# Patient Record
Sex: Male | Born: 1975 | Smoking: Former smoker
Health system: Southern US, Community
[De-identification: ages and names within clinical notes are randomized; demographics above are authoritative.]

## PROBLEM LIST (undated history)

## (undated) DIAGNOSIS — K219 Gastro-esophageal reflux disease without esophagitis: Secondary | ICD-10-CM

## (undated) DIAGNOSIS — R413 Other amnesia: Principal | ICD-10-CM

## (undated) DIAGNOSIS — R51 Headache: Secondary | ICD-10-CM

## (undated) DIAGNOSIS — J45909 Unspecified asthma, uncomplicated: Secondary | ICD-10-CM

## (undated) HISTORY — DX: Unspecified asthma, uncomplicated: J45.909

## (undated) HISTORY — DX: Other amnesia: R41.3

## (undated) HISTORY — PX: KNEE SURGERY: SHX244

## (undated) HISTORY — DX: Headache: R51

## (undated) HISTORY — DX: Gastro-esophageal reflux disease without esophagitis: K21.9

---

## 2015-03-08 ENCOUNTER — Encounter: Payer: Self-pay | Admitting: Neurology

## 2015-03-08 ENCOUNTER — Ambulatory Visit (INDEPENDENT_AMBULATORY_CARE_PROVIDER_SITE_OTHER): Payer: BLUE CROSS/BLUE SHIELD | Admitting: Neurology

## 2015-03-08 VITALS — BP 127/84 | HR 70 | Ht 67.0 in | Wt 233.5 lb

## 2015-03-08 DIAGNOSIS — R519 Headache, unspecified: Secondary | ICD-10-CM

## 2015-03-08 DIAGNOSIS — R51 Headache: Secondary | ICD-10-CM

## 2015-03-08 DIAGNOSIS — R413 Other amnesia: Secondary | ICD-10-CM | POA: Diagnosis not present

## 2015-03-08 HISTORY — DX: Headache, unspecified: R51.9

## 2015-03-08 HISTORY — DX: Other amnesia: R41.3

## 2015-03-08 MED ORDER — VENLAFAXINE HCL ER 37.5 MG PO CP24: ORAL_CAPSULE | ORAL | Status: AC

## 2015-03-08 NOTE — Progress Notes (Signed)
Reason for visit: Headache  Referring physician: Dr. Hungarland  Leonard Khan is a 39 y.o. male  History of present illness:  Leonard Khan is a 39 year old right-handed white male with a history of chronic daily headaches dating back at least 2 decades. The patient indicates that the headaches are bifrontal in nature, spread to the back of the head including the neck and shoulders with tension in this area. The patient generally feels better in the morning, worse later in the day. He reports some nausea, no vomiting with the headache. Occasionally he will have black spots in front of vision, he will usually work through the headache, he indicates that he takes [place] powders up to 6 a day for the headache. Some days however, he does not take any medication. He drinks two soft drinks a day, and iced tea. He oftentimes has difficulty sleeping at night, and he takes medications for this. The patient has had some difficulty with memory over the last 6-12 months. He does have a family history of Alzheimer's disease, and he is concerned about this. He has a history of sleep apnea, he had inability to tolerate the CPAP, he underwent adenoidectomy which seemed to help. The patient does have some fatigue during the day. He denies any snoring. He denies any issues with balance or difficulty controlling the bowels or the bladder. He comes to this office for an evaluation. He has not had a recent head scan done.  Past Medical History  Diagnosis Date  . Asthma   . GERD (gastroesophageal reflux disease)   . Memory difficulties 03/08/2015  . Headache disorder 03/08/2015    Past Surgical History  Procedure Laterality Date  . Knee surgery      History reviewed. No pertinent family history.  Social history:  reports that he has quit smoking. He has never used smokeless tobacco. He reports that he drinks alcohol. He reports that he does not use illicit drugs.  Medications:  Prior to Admission medications     Medication Sig Start Date End Date Taking? Authorizing Provider  dexlansoprazole (DEXILANT) 60 MG capsule Take 60 mg by mouth daily.   Yes Historical Provider, MD  ergocalciferol (VITAMIN D2) 50000 UNITS capsule Take 50,000 Units by mouth once a week.   Yes Historical Provider, MD  venlafaxine XR (EFFEXOR XR) 37.5 MG 24 hr capsule One tablet daily for 1 week, then take 2 tablets daily 03/08/15   Kathrynn Ducking, MD  zolpidem (AMBIEN) 10 MG tablet Take 10 mg by mouth at bedtime as needed for sleep.   Yes Historical Provider, MD     No Known Allergies  ROS:  Out of a complete 14 system review of symptoms, the patient complains only of the following symptoms, and all other reviewed systems are negative.  Ringing in the ears Memory loss, headache Shift work  Blood pressure 127/84, pulse 70, height 5\' 7"  (1.702 m), weight 233 lb 8 oz (105.915 kg).  Physical Exam  General: The patient is alert and cooperative at the time of the examination.  Eyes: Pupils are equal, round, and reactive to light. Discs are flat bilaterally.  Neck: The neck is supple, no carotid bruits are noted.  Respiratory: The respiratory examination is clear.  Cardiovascular: The cardiovascular examination reveals a regular rate and rhythm, no obvious murmurs or rubs are noted.  Skin: Extremities are without significant edema.  Neurologic Exam  Mental status: The patient is alert and oriented x 3 at the time of  the examination. The patient has apparent normal recent and remote memory, with an apparently normal attention span and concentration ability. Mini-Mental Status Examination done today shows a total score 28/30. He is able to name 6 animals in 30 seconds.  Cranial nerves: Facial symmetry is present. There is good sensation of the face to pinprick and soft touch on the left, decreased on the right forehead. The strength of the facial muscles and the muscles to head turning and shoulder shrug are normal  bilaterally. Speech is well enunciated, no aphasia or dysarthria is noted. Extraocular movements are full. Visual fields are full. The tongue is midline, and the patient has symmetric elevation of the soft palate. No obvious hearing deficits are noted.  Motor: The motor testing reveals 5 over 5 strength of all 4 extremities. Good symmetric motor tone is noted throughout.  Sensory: Sensory testing is intact to pinprick, soft touch, vibration sensation, and position sense on all 4 extremities, with exception there is some decrease in pinprick sensation on the right arm. No evidence of extinction is noted.  Coordination: Cerebellar testing reveals good finger-nose-finger and heel-to-shin bilaterally.  Gait and station: Gait is normal. Tandem gait is normal. Romberg is negative. No drift is seen.  Reflexes: Deep tendon reflexes are symmetric and normal bilaterally. Toes are downgoing bilaterally.   Assessment/Plan:  1. Chronic daily headache  2. Memory disturbance  The patient is having a 20 year history of chronic daily headache. The patient likely has at least some component a muscle tension headache, but he appears to be using BC powders on a regular basis which may have some issues with rebound headache. The patient will try to stop the [facility] powders, and get off of the caffeine. This may help him sleep better at night. He will undergo MRI evaluation of the brain, and undergo blood work today. He will be placed on Effexor for the headache, he is to use Advil or Aleve for the headache when the headache comes on. He will follow-up in 3-4 months.  Jill Alexanders MD 03/08/2015 8:41 PM  [facility] [address] [place], [place] [address]  Phone [phone] Fax [phone]

## 2015-03-08 NOTE — Patient Instructions (Addendum)
We will check an MRI of the brain, and start Effexor for the headache. Try to eliminate BC powders and tea.    Headaches, Frequently Asked Questions MIGRAINE HEADACHES Q: What is migraine? What causes it? How can I treat it? A: Generally, migraine headaches begin as a dull ache. Then they develop into a constant, throbbing, and pulsating pain. You may experience pain at the temples. You may experience pain at the front or back of one or both sides of the head. The pain is usually accompanied by a combination of:  Nausea.  Vomiting.  Sensitivity to light and noise. Some people (about 15%) experience an aura (see below) before an attack. The cause of migraine is believed to be chemical reactions in the brain. Treatment for migraine may include over-the-counter or prescription medications. It may also include self-help techniques. These include relaxation training and biofeedback.  Q: What is an aura? A: About 15% of people with migraine get an "aura". This is a sign of neurological symptoms that occur before a migraine headache. You may see wavy or jagged lines, dots, or flashing lights. You might experience tunnel vision or blind spots in one or both eyes. The aura can include visual or auditory hallucinations (something imagined). It may include disruptions in smell (such as strange odors), taste or touch. Other symptoms include:  Numbness.  A "pins and needles" sensation.  Difficulty in recalling or speaking the correct word. These neurological events may last as long as 60 minutes. These symptoms will fade as the headache begins. Q: What is a trigger? A: Certain physical or environmental factors can lead to or "trigger" a migraine. These include:  Foods.  Hormonal changes.  Weather.  Stress. It is important to remember that triggers are different for everyone. To help prevent migraine attacks, you need to figure out which triggers affect you. Keep a headache diary. This is a  good way to track triggers. The diary will help you talk to your healthcare professional about your condition. Q: Does weather affect migraines? A: Bright sunshine, hot, humid conditions, and drastic changes in barometric pressure may lead to, or "trigger," a migraine attack in some people. But studies have shown that weather does not act as a trigger for everyone with migraines. Q: What is the link between migraine and hormones? A: Hormones start and regulate many of your body's functions. Hormones keep your body in balance within a constantly changing environment. The levels of hormones in your body are unbalanced at times. Examples are during menstruation, pregnancy, or menopause. That can lead to a migraine attack. In fact, about three quarters of all women with migraine report that their attacks are related to the menstrual cycle.  Q: Is there an increased risk of stroke for migraine sufferers? A: The likelihood of a migraine attack causing a stroke is very remote. That is not to say that migraine sufferers cannot have a stroke associated with their migraines. In persons under age 41, the most common associated factor for stroke is migraine headache. But over the course of a person's normal life span, the occurrence of migraine headache may actually be associated with a reduced risk of dying from cerebrovascular disease due to stroke.  Q: What are acute medications for migraine? A: Acute medications are used to treat the pain of the headache after it has started. Examples over-the-counter medications, NSAIDs, ergots, and triptans.  Q: What are the triptans? A: Triptans are the newest class of abortive medications. They are specifically  targeted to treat migraine. Triptans are vasoconstrictors. They moderate some chemical reactions in the brain. The triptans work on receptors in your brain. Triptans help to restore the balance of a neurotransmitter called serotonin. Fluctuations in levels of serotonin  are thought to be a main cause of migraine.  Q: Are over-the-counter medications for migraine effective? A: Over-the-counter, or "OTC," medications may be effective in relieving mild to moderate pain and associated symptoms of migraine. But you should see your caregiver before beginning any treatment regimen for migraine.  Q: What are preventive medications for migraine? A: Preventive medications for migraine are sometimes referred to as "prophylactic" treatments. They are used to reduce the frequency, severity, and length of migraine attacks. Examples of preventive medications include antiepileptic medications, antidepressants, beta-blockers, calcium channel blockers, and NSAIDs (nonsteroidal anti-inflammatory drugs). Q: Why are anticonvulsants used to treat migraine? A: During the past few years, there has been an increased interest in antiepileptic drugs for the prevention of migraine. They are sometimes referred to as "anticonvulsants". Both epilepsy and migraine may be caused by similar reactions in the brain.  Q: Why are antidepressants used to treat migraine? A: Antidepressants are typically used to treat people with depression. They may reduce migraine frequency by regulating chemical levels, such as serotonin, in the brain.  Q: What alternative therapies are used to treat migraine? A: The term "alternative therapies" is often used to describe treatments considered outside the scope of conventional Western medicine. Examples of alternative therapy include acupuncture, acupressure, and yoga. Another common alternative treatment is herbal therapy. Some herbs are believed to relieve headache pain. Always discuss alternative therapies with your caregiver before proceeding. Some herbal products contain arsenic and other toxins. TENSION HEADACHES Q: What is a tension-type headache? What causes it? How can I treat it? A: Tension-type headaches occur randomly. They are often the result of temporary  stress, anxiety, fatigue, or anger. Symptoms include soreness in your temples, a tightening band-like sensation around your head (a "vice-like" ache). Symptoms can also include a pulling feeling, pressure sensations, and contracting head and neck muscles. The headache begins in your forehead, temples, or the back of your head and neck. Treatment for tension-type headache may include over-the-counter or prescription medications. Treatment may also include self-help techniques such as relaxation training and biofeedback. CLUSTER HEADACHES Q: What is a cluster headache? What causes it? How can I treat it? A: Cluster headache gets its name because the attacks come in groups. The pain arrives with little, if any, warning. It is usually on one side of the head. A tearing or bloodshot eye and a runny nose on the same side of the headache may also accompany the pain. Cluster headaches are believed to be caused by chemical reactions in the brain. They have been described as the most severe and intense of any headache type. Treatment for cluster headache includes prescription medication and oxygen. SINUS HEADACHES Q: What is a sinus headache? What causes it? How can I treat it? A: When a cavity in the bones of the face and skull (a sinus) becomes inflamed, the inflammation will cause localized pain. This condition is usually the result of an allergic reaction, a tumor, or an infection. If your headache is caused by a sinus blockage, such as an infection, you will probably have a fever. An x-ray will confirm a sinus blockage. Your caregiver's treatment might include antibiotics for the infection, as well as antihistamines or decongestants.  REBOUND HEADACHES Q: What is a rebound headache? What causes  it? How can I treat it? A: A pattern of taking acute headache medications too often can lead to a condition known as "rebound headache." A pattern of taking too much headache medication includes taking it more than 2 days  per week or in excessive amounts. That means more than the label or a caregiver advises. With rebound headaches, your medications not only stop relieving pain, they actually begin to cause headaches. Doctors treat rebound headache by tapering the medication that is being overused. Sometimes your caregiver will gradually substitute a different type of treatment or medication. Stopping may be a challenge. Regularly overusing a medication increases the potential for serious side effects. Consult a caregiver if you regularly use headache medications more than 2 days per week or more than the label advises. ADDITIONAL QUESTIONS AND ANSWERS Q: What is biofeedback? A: Biofeedback is a self-help treatment. Biofeedback uses special equipment to monitor your body's involuntary physical responses. Biofeedback monitors:  Breathing.  Pulse.  Heart rate.  Temperature.  Muscle tension.  Brain activity. Biofeedback helps you refine and perfect your relaxation exercises. You learn to control the physical responses that are related to stress. Once the technique has been mastered, you do not need the equipment any more. Q: Are headaches hereditary? A: Four out of five (80%) of people that suffer report a family history of migraine. Scientists are not sure if this is genetic or a family predisposition. Despite the uncertainty, a child has a 50% chance of having migraine if one parent suffers. The child has a 75% chance if both parents suffer.  Q: Can children get headaches? A: By the time they reach high school, most young people have experienced some type of headache. Many safe and effective approaches or medications can prevent a headache from occurring or stop it after it has begun.  Q: What type of doctor should I see to diagnose and treat my headache? A: Start with your primary caregiver. Discuss his or her experience and approach to headaches. Discuss methods of classification, diagnosis, and treatment. Your  caregiver may decide to recommend you to a headache specialist, depending upon your symptoms or other physical conditions. Having diabetes, allergies, etc., may require a more comprehensive and inclusive approach to your headache. The National Headache Foundation will provide, upon request, a list of [facility] physician members in your state. Document Released: 09/23/2003 Document Revised: 09/25/2011 Document Reviewed: 03/02/2008 [facility] Patient Information 2015 [place], [place]. This information is not intended to replace advice given to you by your health care provider. Make sure you discuss any questions you have with your health care provider.

## 2015-03-10 ENCOUNTER — Telehealth: Payer: Self-pay

## 2015-03-10 LAB — COPPER, SERUM: COPPER: 97 ug/dL (ref 72–166)

## 2015-03-10 LAB — SEDIMENTATION RATE: SED RATE: 3 mm/h (ref 0–15)

## 2015-03-10 LAB — HIV ANTIBODY (ROUTINE TESTING W REFLEX): HIV SCREEN 4TH GENERATION: NONREACTIVE

## 2015-03-10 LAB — BASIC METABOLIC PANEL
BUN / CREAT RATIO: 11 (ref 8–19)
BUN: 11 mg/dL (ref 6–20)
CHLORIDE: 100 mmol/L (ref 97–108)
CO2: 25 mmol/L (ref 18–29)
Calcium: 10.1 mg/dL (ref 8.7–10.2)
Creatinine, Ser: 1.01 mg/dL (ref 0.76–1.27)
GFR, EST AFRICAN AMERICAN: 109 mL/min/{1.73_m2} (ref >59)
GFR, EST NON AFRICAN AMERICAN: 95 mL/min/{1.73_m2} (ref >59)
Glucose: 81 mg/dL (ref 65–99)
POTASSIUM: 4.2 mmol/L (ref 3.5–5.2)
Sodium: 142 mmol/L (ref 134–144)

## 2015-03-10 LAB — RPR: RPR: NONREACTIVE

## 2015-03-10 LAB — VITAMIN B12: VITAMIN B 12: 436 pg/mL (ref 211–946)

## 2015-03-10 NOTE — Telephone Encounter (Signed)
Spoke with patient and informed him that all labs results are unremarkable.  Patient verbalized understanding.

## 2015-03-31 ENCOUNTER — Ambulatory Visit (INDEPENDENT_AMBULATORY_CARE_PROVIDER_SITE_OTHER): Payer: BLUE CROSS/BLUE SHIELD

## 2015-03-31 DIAGNOSIS — R51 Headache: Secondary | ICD-10-CM | POA: Diagnosis not present

## 2015-03-31 DIAGNOSIS — R413 Other amnesia: Secondary | ICD-10-CM | POA: Diagnosis not present

## 2015-03-31 DIAGNOSIS — R519 Headache, unspecified: Secondary | ICD-10-CM

## 2015-03-31 MED ORDER — GADOPENTETATE DIMEGLUMINE 469.01 MG/ML IV SOLN: 20.0000 mL | Freq: Once | INTRAVENOUS | Status: AC | PRN

## 2015-04-01 ENCOUNTER — Telehealth: Payer: Self-pay | Admitting: Neurology

## 2015-04-01 NOTE — Telephone Encounter (Signed)
I called patient. MRI the brain is unremarkable. The patient has cut back on the caffeine intake and BC powders. He was afraid to start the Effexor, I have encouraged him to begin the medication as he is still having a lot of headache. He will call me if he believes that he is having any side effects.   MRI brain 04/01/2015:  IMPRESSION:  Normal MRI brain (with and without).

## 2021-06-02 ENCOUNTER — Other Ambulatory Visit: Payer: Self-pay | Admitting: Plastic Surgery

## 2021-06-02 DIAGNOSIS — D481 Neoplasm of uncertain behavior of connective and other soft tissue: Secondary | ICD-10-CM

## 2021-06-14 ENCOUNTER — Other Ambulatory Visit: Payer: BLUE CROSS/BLUE SHIELD

## 2021-06-16 ENCOUNTER — Ambulatory Visit
Admission: RE | Admit: 2021-06-16 | Discharge: 2021-06-16 | Disposition: A | Discharge status: Home / Self Care | Payer: Self-pay | Source: Ambulatory Visit | Attending: Plastic Surgery | Admitting: Plastic Surgery

## 2021-06-16 ENCOUNTER — Other Ambulatory Visit: Payer: Self-pay

## 2021-06-16 ENCOUNTER — Other Ambulatory Visit: Payer: Self-pay | Admitting: Plastic Surgery

## 2021-06-16 DIAGNOSIS — D481 Neoplasm of uncertain behavior of connective and other soft tissue: Secondary | ICD-10-CM

## 2021-06-16 MED ORDER — GADOBENATE DIMEGLUMINE 529 MG/ML IV SOLN
15.0000 mL | Freq: Once | INTRAVENOUS | Status: AC | PRN
  Administered 2021-06-16: 15 mL via INTRAVENOUS

## 2022-10-05 IMAGING — MR MR CHEST MEDIASTINUM WO/W CM
16 series · 16 of 16 positions shown · IV contrast (multihance)
Comparison: None.

CLINICAL DATA: Right anterior chest wall mass.

EXAM:
MRI CHEST WITHOUT AND WITH CONTRAST
TECHNIQUE: Multiplanar multisequence imaging of the chest was performed pre and
post administration of contrast material.
CONTRAST:  15mL MULTIHANCE GADOBENATE DIMEGLUMINE 529 MG/ML IV SOLN

[Series 3: localizer_haste bilateral · axial · 6.0mm · 1.56mm/px · 1 of 44 slices shown]
[im 1/44]
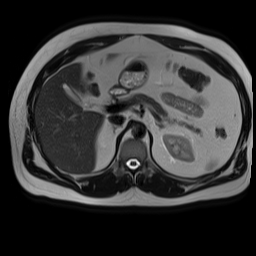

[Series 4: T2 · sagittal · 5.0mm · 0.67mm/px · 1 of 28 slices shown]
[im 1/28]
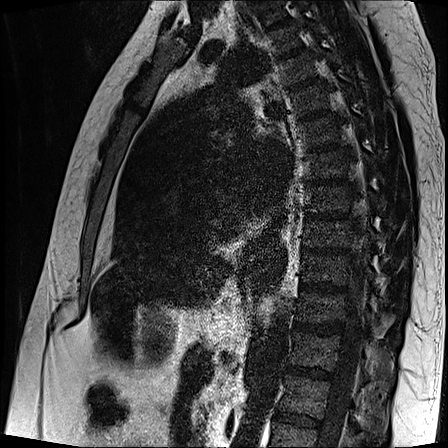

[Series 5: STIR · sagittal · 5.0mm · 0.94mm/px · 1 of 28 slices shown (1 of 2)]
[im 1/28]
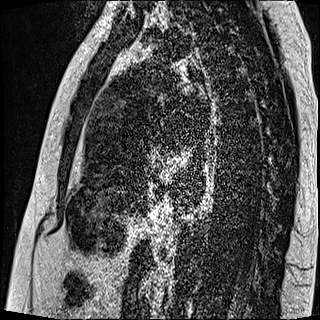

[Series 6: T1 · coronal · 3.0mm · 0.78mm/px · 1 of 30 slices shown (1 of 5)]
[im 1/30]
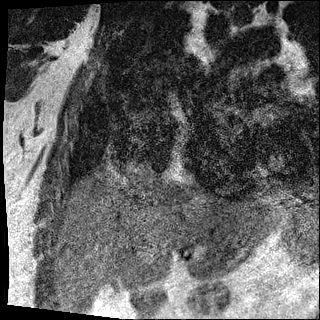

[Series 8: T1 · axial · non-contrast · 4.0mm · 0.75mm/px · 1 of 35 slices shown (2 of 5)]
[im 1/35]
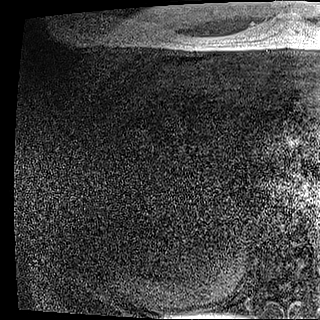

[Series 10: STIR · sagittal · 5.0mm · 0.94mm/px · 1 of 28 slices shown (2 of 2)]
[im 1/28]
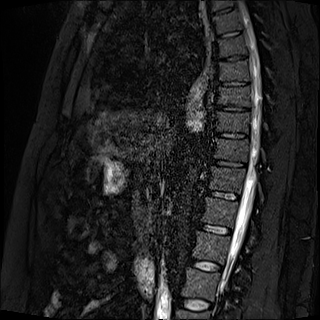

[Series 13: T1 · coronal · 3.0mm · 0.78mm/px · 1 of 30 slices shown (3 of 5)]
[im 1/30]
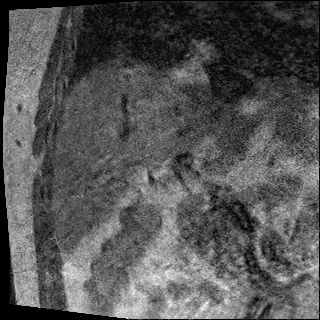

[Series 14: T2 fat-sat · coronal · 3.0mm · 0.75mm/px · 1 of 24 slices shown (1 of 3)]
[im 1/24]
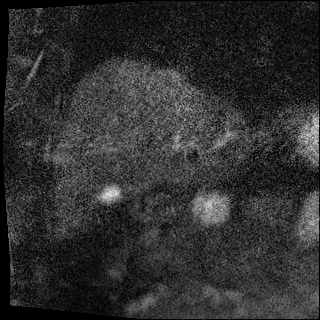

[Series 15: T1 · axial · non-contrast · 4.0mm · 0.98mm/px · 1 of 35 slices shown (4 of 5)]
[im 1/35]
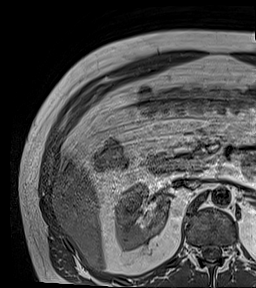

[Series 16: T2 fat-sat · axial · 4.0mm · 0.49mm/px · 1 of 35 slices shown (2 of 3)]
[im 1/35]
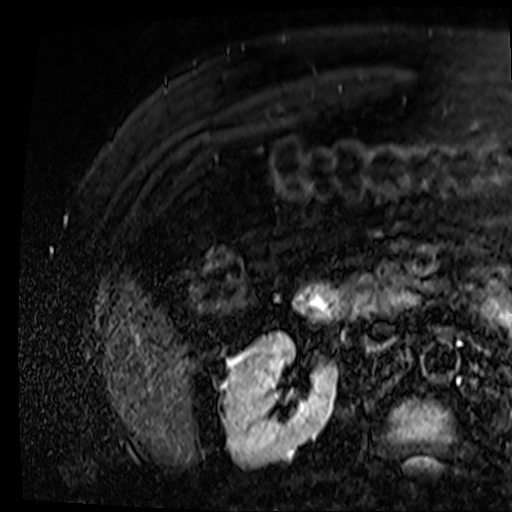

[Series 17: T1 fat-sat · sagittal · non-contrast · 4.0mm · 0.78mm/px · 1 of 30 slices shown (1 of 3)]
[im 1/30]
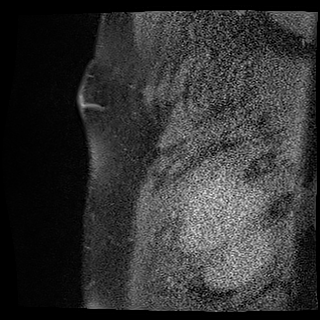

[Series 18: T1 · sagittal · 3.0mm · 0.78mm/px · 1 of 30 slices shown (5 of 5)]
[im 1/30]
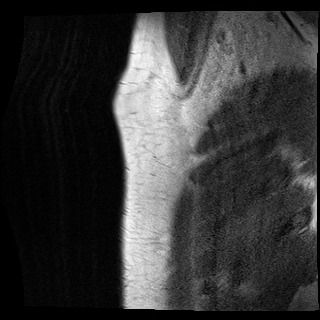

[Series 19: T1 fat-sat · axial · non-contrast · 4.0mm · 0.78mm/px · 1 of 30 slices shown (2 of 3)]
[im 1/30]
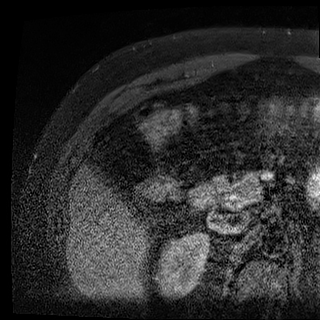

[Series 20: T2 fat-sat · coronal · 3.0mm · 0.94mm/px · 1 of 30 slices shown (3 of 3)]
[im 1/30]
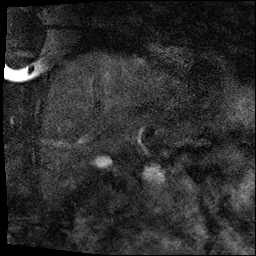

[Series 21: T1 fat-sat post-contrast · axial · 4.0mm · 0.75mm/px · 1 of 30 slices shown]
[im 1/30]
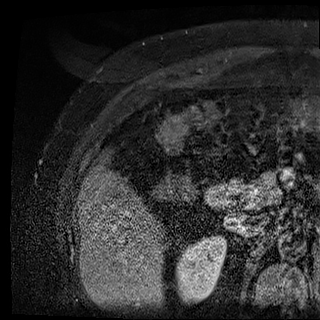

[Series 23: T1 fat-sat · coronal · 3.0mm · 0.75mm/px · 1 of 30 slices shown (3 of 3)]
[im 1/30]
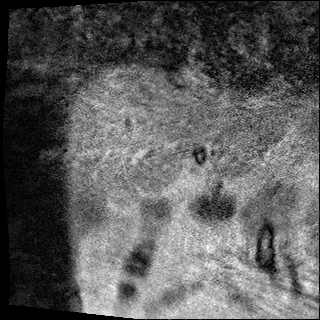

[16 of 16 positions shown; findings below may reference images not displayed]

FINDINGS: There is a small encapsulated fatty lesion involving the right chest
wall. This measures a maximum of 5 x 4 cm. No worrisome MR imaging
features are identified. There is complete fat saturation and no
contrast enhancement. Findings consistent with a benign subcutaneous
lipoma.

The bony thorax is intact. The chest wall musculature is
unremarkable. No right lung abnormalities are demonstrated. The
upper liver appears normal.
IMPRESSION: 5 x 4 cm subcutaneous lipoma involving the right chest wall.
